# Patient Record
Sex: Male | Born: 1945 | Race: Black or African American | Hispanic: No | Marital: Single | Smoking: Current every day smoker
Health system: Southern US, Community
[De-identification: ages and names within clinical notes are randomized; demographics above are authoritative.]

## PROBLEM LIST (undated history)

## (undated) DIAGNOSIS — I10 Essential (primary) hypertension: Secondary | ICD-10-CM

## (undated) DIAGNOSIS — I639 Cerebral infarction, unspecified: Secondary | ICD-10-CM

---

## 2004-08-10 ENCOUNTER — Other Ambulatory Visit: Payer: Self-pay

## 2004-08-11 ENCOUNTER — Inpatient Hospital Stay: Payer: Self-pay | Admitting: Cardiology

## 2004-08-12 ENCOUNTER — Other Ambulatory Visit: Payer: Self-pay

## 2004-08-14 ENCOUNTER — Other Ambulatory Visit: Payer: Self-pay

## 2007-10-22 ENCOUNTER — Other Ambulatory Visit: Payer: Self-pay

## 2007-10-23 ENCOUNTER — Inpatient Hospital Stay: Payer: Self-pay | Admitting: Internal Medicine

## 2009-06-22 ENCOUNTER — Emergency Department: Payer: Self-pay | Admitting: Emergency Medicine

## 2011-11-21 ENCOUNTER — Emergency Department: Payer: Self-pay | Admitting: *Deleted

## 2011-11-21 LAB — URINALYSIS, COMPLETE
Bacteria: NONE SEEN
Glucose,UR: NEGATIVE mg/dL (ref 0–75)
Hyaline Cast: 3
Ketone: NEGATIVE
Nitrite: NEGATIVE
Protein: 30
RBC,UR: 2 /HPF (ref 0–5)
Specific Gravity: 1.018 (ref 1.003–1.030)
Squamous Epithelial: 1
Transitional Epi: <1
WBC UR: 6 /HPF (ref 0–5)

## 2011-11-21 LAB — COMPREHENSIVE METABOLIC PANEL
Albumin: 4.3 g/dL (ref 3.4–5.0)
Alkaline Phosphatase: 101 U/L (ref 50–136)
Anion Gap: 13 (ref 7–16)
BUN: 35 mg/dL — ABNORMAL HIGH (ref 7–18)
Calcium, Total: 9.1 mg/dL (ref 8.5–10.1)
Creatinine: 2.71 mg/dL — ABNORMAL HIGH (ref 0.60–1.30)
EGFR (African American): 30 — ABNORMAL LOW
EGFR (Non-African Amer.): 25 — ABNORMAL LOW
Glucose: 103 mg/dL — ABNORMAL HIGH (ref 65–99)
SGOT(AST): 32 U/L (ref 15–37)

## 2011-11-21 LAB — CBC
HCT: 42.6 % (ref 40.0–52.0)
MCHC: 32.7 g/dL (ref 32.0–36.0)
MCV: 81 fL (ref 80–100)
Platelet: 214 10*3/uL (ref 150–440)
RDW: 16 % — ABNORMAL HIGH (ref 11.5–14.5)
WBC: 7 10*3/uL (ref 3.8–10.6)

## 2012-07-17 ENCOUNTER — Emergency Department: Payer: Self-pay | Admitting: Emergency Medicine

## 2012-07-17 LAB — URINALYSIS, COMPLETE
Bilirubin,UR: NEGATIVE
Glucose,UR: NEGATIVE mg/dL (ref 0–75)
Ketone: NEGATIVE
Protein: NEGATIVE
RBC,UR: 3 /HPF (ref 0–5)
Squamous Epithelial: 1
WBC UR: 25 /HPF (ref 0–5)

## 2012-07-17 LAB — COMPREHENSIVE METABOLIC PANEL
Calcium, Total: 9 mg/dL (ref 8.5–10.1)
Chloride: 111 mmol/L — ABNORMAL HIGH (ref 98–107)
Co2: 19 mmol/L — ABNORMAL LOW (ref 21–32)
Creatinine: 2.59 mg/dL — ABNORMAL HIGH (ref 0.60–1.30)
EGFR (African American): 28 — ABNORMAL LOW
EGFR (Non-African Amer.): 24 — ABNORMAL LOW
Glucose: 93 mg/dL (ref 65–99)
Osmolality: 290 (ref 275–301)
SGOT(AST): 24 U/L (ref 15–37)
SGPT (ALT): 21 U/L (ref 12–78)

## 2012-07-17 LAB — CBC
HCT: 34.9 % — ABNORMAL LOW (ref 40.0–52.0)
MCH: 25.9 pg — ABNORMAL LOW (ref 26.0–34.0)
MCV: 78 fL — ABNORMAL LOW (ref 80–100)
Platelet: 275 10*3/uL (ref 150–440)
RDW: 16.7 % — ABNORMAL HIGH (ref 11.5–14.5)

## 2012-07-19 LAB — URINE CULTURE

## 2020-06-28 ENCOUNTER — Emergency Department
Admission: EM | Admit: 2020-06-28 | Discharge: 2020-06-28 | Disposition: A | Discharge status: Home / Self Care | Payer: Medicare Other | Attending: Student in an Organized Health Care Education/Training Program | Admitting: Student in an Organized Health Care Education/Training Program

## 2020-06-28 ENCOUNTER — Other Ambulatory Visit: Payer: Self-pay

## 2020-06-28 ENCOUNTER — Emergency Department: Payer: Medicare Other

## 2020-06-28 DIAGNOSIS — N401 Enlarged prostate with lower urinary tract symptoms: Secondary | ICD-10-CM | POA: Insufficient documentation

## 2020-06-28 DIAGNOSIS — R112 Nausea with vomiting, unspecified: Secondary | ICD-10-CM | POA: Diagnosis not present

## 2020-06-28 DIAGNOSIS — I10 Essential (primary) hypertension: Secondary | ICD-10-CM | POA: Insufficient documentation

## 2020-06-28 DIAGNOSIS — R42 Dizziness and giddiness: Secondary | ICD-10-CM

## 2020-06-28 DIAGNOSIS — R519 Headache, unspecified: Secondary | ICD-10-CM | POA: Insufficient documentation

## 2020-06-28 DIAGNOSIS — N200 Calculus of kidney: Secondary | ICD-10-CM | POA: Insufficient documentation

## 2020-06-28 DIAGNOSIS — I700 Atherosclerosis of aorta: Secondary | ICD-10-CM | POA: Diagnosis not present

## 2020-06-28 DIAGNOSIS — F17200 Nicotine dependence, unspecified, uncomplicated: Secondary | ICD-10-CM | POA: Insufficient documentation

## 2020-06-28 DIAGNOSIS — E860 Dehydration: Secondary | ICD-10-CM

## 2020-06-28 HISTORY — DX: Essential (primary) hypertension: I10

## 2020-06-28 HISTORY — DX: Cerebral infarction, unspecified: I63.9

## 2020-06-28 LAB — COMPREHENSIVE METABOLIC PANEL
ALT: 19 U/L (ref 0–44)
AST: 22 U/L (ref 15–41)
Albumin: 4.2 g/dL (ref 3.5–5.0)
Alkaline Phosphatase: 89 U/L (ref 38–126)
Anion gap: 11 (ref 5–15)
BUN: 48 mg/dL — ABNORMAL HIGH (ref 8–23)
CO2: 20 mmol/L — ABNORMAL LOW (ref 22–32)
Calcium: 9.4 mg/dL (ref 8.9–10.3)
Chloride: 108 mmol/L (ref 98–111)
Creatinine, Ser: 3.42 mg/dL — ABNORMAL HIGH (ref 0.61–1.24)
GFR, Estimated: 16 mL/min — ABNORMAL LOW (ref >60)
Glucose, Bld: 105 mg/dL — ABNORMAL HIGH (ref 70–99)
Potassium: 4.6 mmol/L (ref 3.5–5.1)
Sodium: 139 mmol/L (ref 135–145)
Total Bilirubin: 0.7 mg/dL (ref 0.3–1.2)
Total Protein: 7.5 g/dL (ref 6.5–8.1)

## 2020-06-28 LAB — CBC
HCT: 36.7 % — ABNORMAL LOW (ref 39.0–52.0)
Hemoglobin: 11.9 g/dL — ABNORMAL LOW (ref 13.0–17.0)
MCH: 26.1 pg (ref 26.0–34.0)
MCHC: 32.4 g/dL (ref 30.0–36.0)
MCV: 80.5 fL (ref 80.0–100.0)
Platelets: 205 10*3/uL (ref 150–400)
RBC: 4.56 MIL/uL (ref 4.22–5.81)
RDW: 16.7 % — ABNORMAL HIGH (ref 11.5–15.5)
WBC: 8.4 10*3/uL (ref 4.0–10.5)
nRBC: 0 % (ref 0.0–0.2)

## 2020-06-28 LAB — LIPASE, BLOOD: Lipase: 65 U/L — ABNORMAL HIGH (ref 11–51)

## 2020-06-28 LAB — URINALYSIS, COMPLETE (UACMP) WITH MICROSCOPIC
Bacteria, UA: NONE SEEN
Bilirubin Urine: NEGATIVE
Glucose, UA: NEGATIVE mg/dL
Hgb urine dipstick: NEGATIVE
Ketones, ur: NEGATIVE mg/dL
Leukocytes,Ua: NEGATIVE
Nitrite: NEGATIVE
Protein, ur: 100 mg/dL — AB
Specific Gravity, Urine: 1.013 (ref 1.005–1.030)
pH: 6 (ref 5.0–8.0)

## 2020-06-28 MED ORDER — SODIUM CHLORIDE 0.9 % IV BOLUS
1000.0000 mL | Freq: Once | INTRAVENOUS | Status: AC
  Administered 2020-06-28: 1000 mL via INTRAVENOUS

## 2020-06-28 NOTE — ED Notes (Signed)
Discharge intructions reviewed with patient and significant other. Pt AOx4, Denied pain or sob

## 2020-06-28 NOTE — Discharge Instructions (Addendum)
Please call you kidney doctor for appointment to recheck blood work and make sure you are staying hydrated.   CT abd/Pelv  1. No acute noncontrast CT findings of the abdomen or pelvis to  explain nausea or vomiting. No evidence of pancreatitis.  2. There is a multilobulated hyperdense lesion of the anterior  superior pole of the left kidney measuring approximately 2.5 x 2.4  cm, concerning for mass. A proteinaceous or hemorrhagic cyst could  have this appearance. Recommend renal ultrasound for initial further  evaluation on a nonemergent basis.  3. Nonobstructive left nephrolithiasis.  4. Prostatomegaly.  5. Aortic Atherosclerosis (ICD10-I70.0).

## 2020-06-28 NOTE — ED Triage Notes (Addendum)
Pt states he was awoken by a headache around 6AM and then vomited while drinking his coffee- pt was given 2 200mg  ibuprofen this AM which he states helped and his headache is now gone

## 2020-06-28 NOTE — ED Notes (Signed)
Patient denies pain and is resting comfortably.  

## 2020-06-28 NOTE — ED Provider Notes (Signed)
[facility] Emergency Department Provider Note    First MD Initiated Contact with Patient 06/28/20 1331     (approximate)  I have reviewed the triage vital signs and the nursing notes.   HISTORY  Chief Complaint Emesis and Headache    HPI Johnny Williamson is a 74 y.o. male history of hypertension stroke presents to the ER for evaluation of dizziness associated with nausea and headache that started this morning.  Does not typically get headaches.  States he drank some coffee as well as took some Motrin with improvement in his pain but still feeling some nausea.  Also feeling some lightheadedness particular when he gets up to walk.  Does have chronic kidney disease.  Takes a baby aspirin.  Denies any trauma.  No chest pain or shortness of breath.  Nuys any fevers.  No neck stiffness.    Past Medical History:  Diagnosis Date  . Hypertension   . Stroke [facility])    No family history on file. History reviewed. No pertinent surgical history. There are no problems to display for this patient.     Prior to Admission medications   Not on File    Allergies Patient has no known allergies.    Social History Social History   Tobacco Use  . Smoking status: Current Every Day Smoker    Packs/day: 0.25  . Smokeless tobacco: Never Used  Substance Use Topics  . Alcohol use: Not on file  . Drug use: Not on file    Review of Systems Patient denies headaches, rhinorrhea, blurry vision, numbness, shortness of breath, chest pain, edema, cough, abdominal pain, nausea, vomiting, diarrhea, dysuria, fevers, rashes or hallucinations unless otherwise stated above in HPI. ____________________________________________   PHYSICAL EXAM:  VITAL SIGNS: Vitals:   06/28/20 1153  BP: (!) 142/59  Pulse: 64  Resp: 18  Temp: 98.6 F (37 C)  SpO2: 100%    Constitutional: Alert and oriented.  Eyes: Conjunctivae are normal.  Head: Atraumatic. Nose: No  congestion/rhinnorhea. Mouth/Throat: Mucous membranes are moist.   Neck: No stridor. Painless ROM.  Cardiovascular: Normal rate, regular rhythm. Grossly normal heart sounds.  Good peripheral circulation. Respiratory: Normal respiratory effort.  No retractions. Lungs CTAB. Gastrointestinal: Soft and nontender. No distention. No abdominal bruits. No CVA tenderness. Genitourinary:  Musculoskeletal: No lower extremity tenderness nor edema.  No joint effusions. Neurologic:  Normal speech and language. No gross focal neurologic deficits are appreciated. No facial droop Skin:  Skin is warm, dry and intact. No rash noted. Psychiatric: Mood and affect are normal. Speech and behavior are normal.  ____________________________________________   LABS (all labs ordered are listed, but only abnormal results are displayed)  Results for orders placed or performed during the hospital encounter of 06/28/20 (from the past 24 hour(s))  Lipase, blood     Status: Abnormal   Collection Time: 06/28/20 12:01 PM  Result Value Ref Range   Lipase 65 (H) 11 - 51 U/L  Comprehensive metabolic panel     Status: Abnormal   Collection Time: 06/28/20 12:01 PM  Result Value Ref Range   Sodium 139 135 - 145 mmol/L   Potassium 4.6 3.5 - 5.1 mmol/L   Chloride 108 98 - 111 mmol/L   CO2 20 (L) 22 - 32 mmol/L   Glucose, Bld 105 (H) 70 - 99 mg/dL   BUN 48 (H) 8 - 23 mg/dL   Creatinine, Ser 7.35 (H) 0.61 - 1.24 mg/dL   Calcium 9.4 8.9 - 32.9 mg/dL  Total Protein 7.5 6.5 - 8.1 g/dL   Albumin 4.2 3.5 - 5.0 g/dL   AST 22 15 - 41 U/L   ALT 19 0 - 44 U/L   Alkaline Phosphatase 89 38 - 126 U/L   Total Bilirubin 0.7 0.3 - 1.2 mg/dL   GFR, Estimated 16 (L) >60 mL/min   Anion gap 11 5 - 15  CBC     Status: Abnormal   Collection Time: 06/28/20 12:01 PM  Result Value Ref Range   WBC 8.4 4.0 - 10.5 K/uL   RBC 4.56 4.22 - 5.81 MIL/uL   Hemoglobin 11.9 (L) 13.0 - 17.0 g/dL   HCT 19.1 (L) 39 - 52 %   MCV 80.5 80.0 - 100.0 fL    MCH 26.1 26.0 - 34.0 pg   MCHC 32.4 30.0 - 36.0 g/dL   RDW 47.8 (H) 29.5 - 62.1 %   Platelets 205 150 - 400 K/uL   nRBC 0.0 0.0 - 0.2 %   ____________________________________________  EKG My review and personal interpretation at Time: 13:57   Indication: nausea  Rate: 80  Rhythm: sinus Axis: normal Other: inferolateral twi, c/w previous tracing ____________________________________________  RADIOLOGY  I personally reviewed all radiographic images ordered to evaluate for the above acute complaints and reviewed radiology reports and findings.  These findings were personally discussed with the patient.  Please see medical record for radiology report.  ____________________________________________   PROCEDURES  Procedure(s) performed:  Procedures    Critical Care performed: no ____________________________________________   INITIAL IMPRESSION / ASSESSMENT AND PLAN / ED COURSE  Pertinent labs & imaging results that were available during my care of the patient were reviewed by me and considered in my medical decision making (see chart for details).   DDX: Dehydration, electrolyte abnormality, pancreatitis, enteritis, SBO, migraine, IPH  Johnny Williamson is a 74 y.o. who presents to the ED with presentation as described above. Patient nontoxic-appearing describing some symptoms of dehydration and blood work does show evidence of mild AKI but no evidence of significant electrolyte abnormality. He is not complaining of any chest pain shortness of breath. Only had one episode of vomiting. CT imaging ordered for the but differential is fortunately reassuring. Does have some chronic findings that we discussed. No sign of UTI. Will give IV fluids and assess if the patient is feeling better as I suspect this is largely secondary to dehydration.  Clinical Course as of Jun 28 1546  Sat Jun 28, 2020  1543 Patient reassessed. Feels significantly improved after IV hydration. No sign of  urinary tract obstruction by CT. Does not have any orthostasis or symptoms of lightheadedness with ambulating. He is tolerating p.o. Does appear appropriate for outpatient follow-up.   [PR]    Clinical Course User Index [PR] Willy Eddy, MD    The patient was evaluated in Emergency Department today for the symptoms described in the history of present illness. He/she was evaluated in the context of the global COVID-19 pandemic, which necessitated consideration that the patient might be at risk for infection with the SARS-CoV-2 virus that causes COVID-19. Institutional protocols and algorithms that pertain to the evaluation of patients at risk for COVID-19 are in a state of rapid change based on information released by regulatory bodies including the CDC and federal and state organizations. These policies and algorithms were followed during the patient's care in the ED.  As part of my medical decision making, I reviewed the following data within the electronic MEDICAL RECORD NUMBER Nursing notes  reviewed and incorporated, Labs reviewed, notes from prior ED visits and [place] Controlled Substance Database   ____________________________________________   FINAL CLINICAL IMPRESSION(S) / ED DIAGNOSES  Final diagnoses:  Dehydration  Lightheadedness      NEW MEDICATIONS STARTED DURING THIS VISIT:  New Prescriptions   No medications on file     Note:  This document was prepared using Dragon voice recognition software and may include unintentional dictation errors.    Willy Eddy, MD 06/28/20 [phone]

## 2021-06-01 IMAGING — CT CT HEAD W/O CM
3 series · 16 of 47 positions shown, 19 images · non-contrast
Comparison: 11/21/2011

CLINICAL DATA: Headache, dizziness

EXAM:
CT HEAD WITHOUT CONTRAST
TECHNIQUE: Contiguous axial images were obtained from the base of the skull
through the vertex without intravenous contrast.

[Series 2: head wo · axial · 0.41mm/px · z∈[-132,-2]mm · 10 of 32 slices shown, 13 images]
[im 3/32  brain]
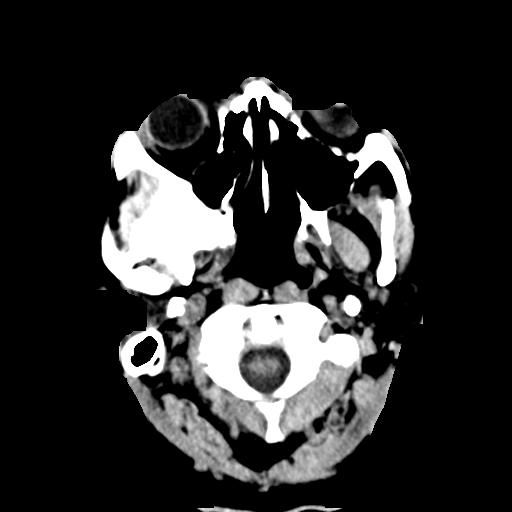
[im 3/32  bone]
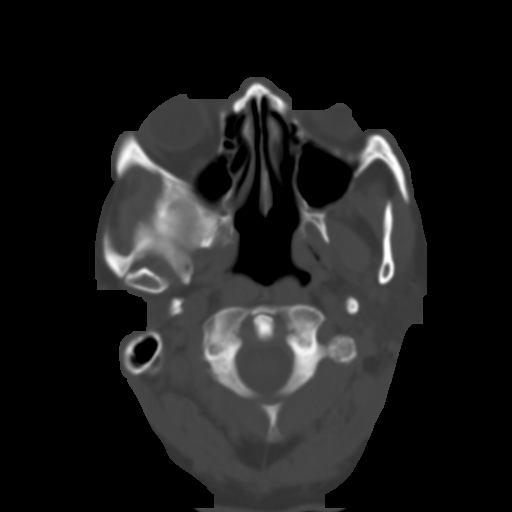
[im 6/32  brain]
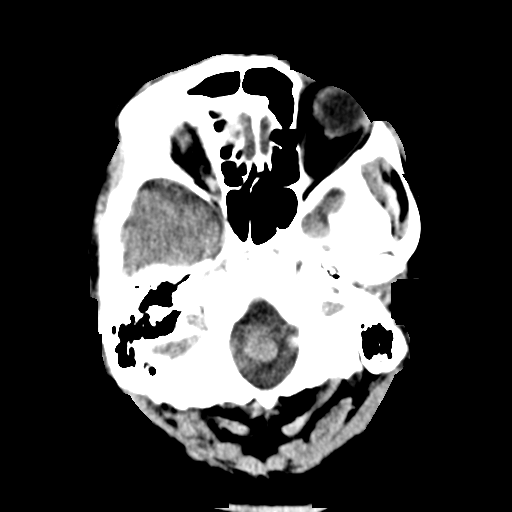
[im 9/32  brain]
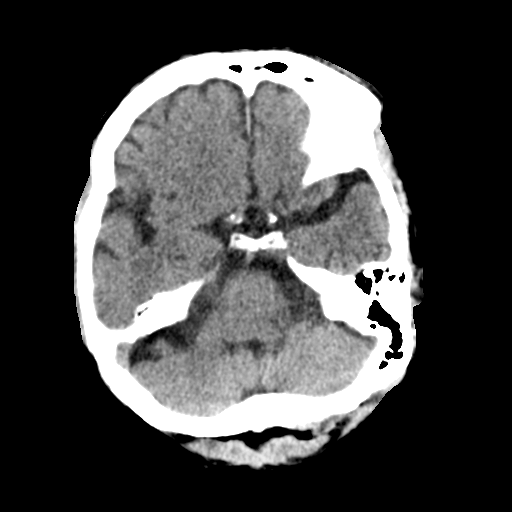
[im 11/32  brain]
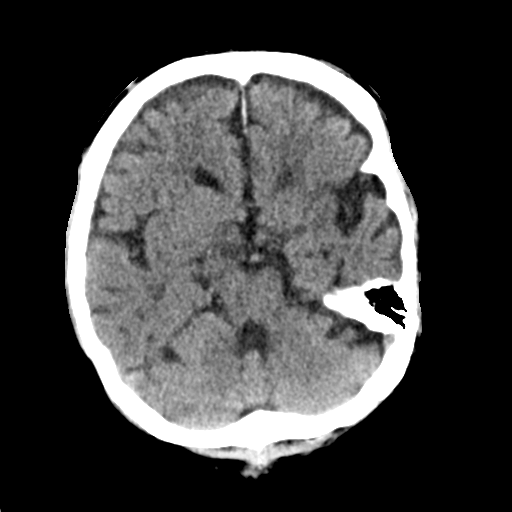
[im 14/32  brain]
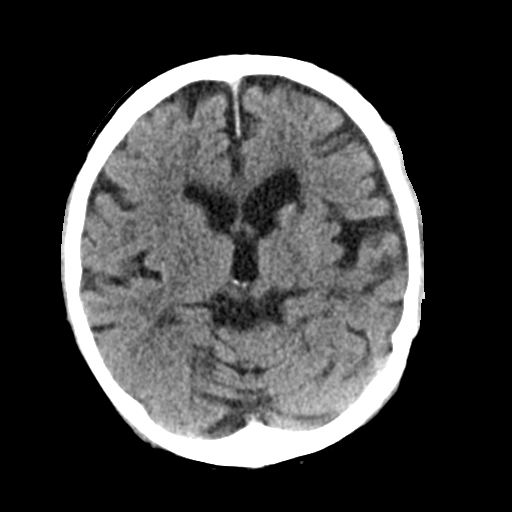
[im 14/32  bone]
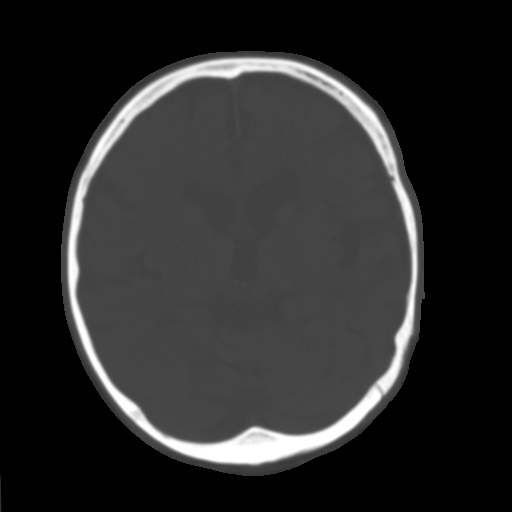
[im 18/32  brain]
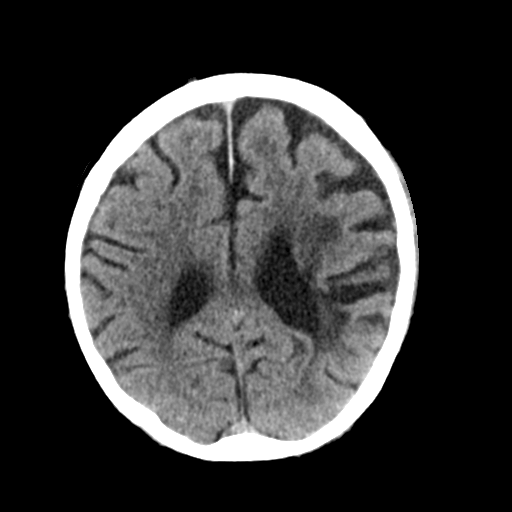
[im 21/32  brain]
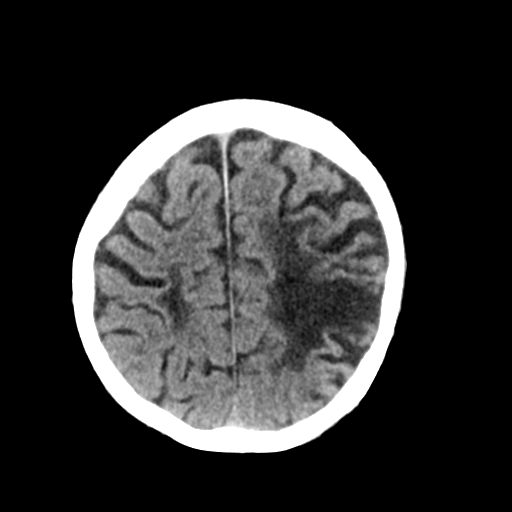
[im 24/32  brain]
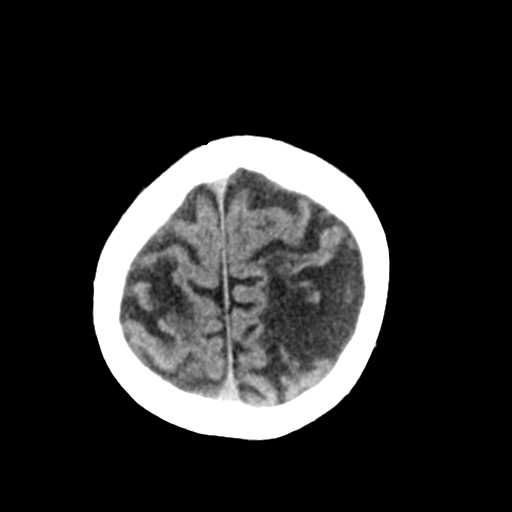
[im 26/32  brain]
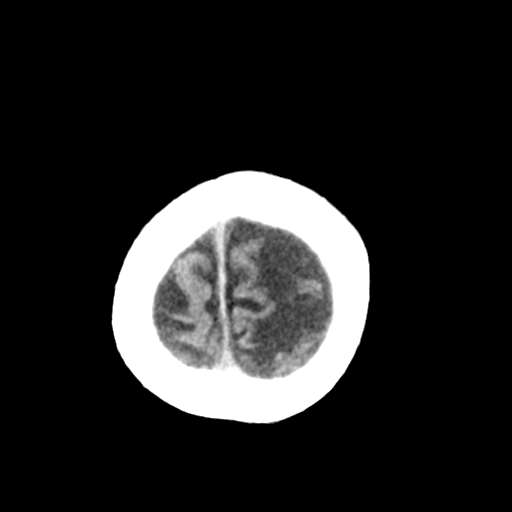
[im 26/32  bone]
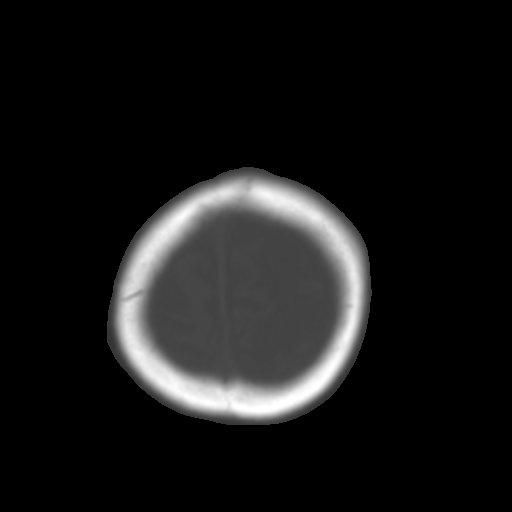
[im 29/32  brain]
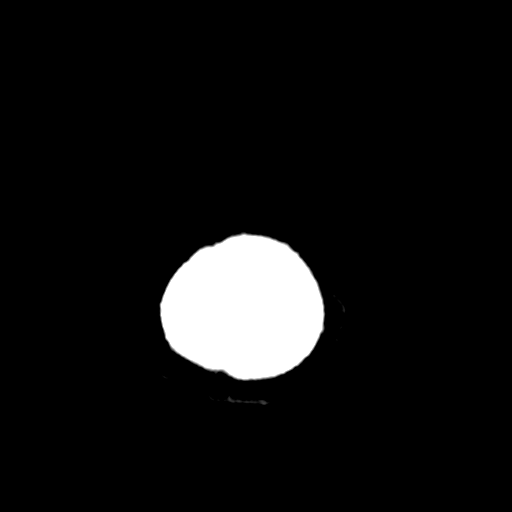

[Series 4: coronal soft tissue · coronal · 0.33mm/px · 3 of 68 slices shown]
[im 23/68  brain]
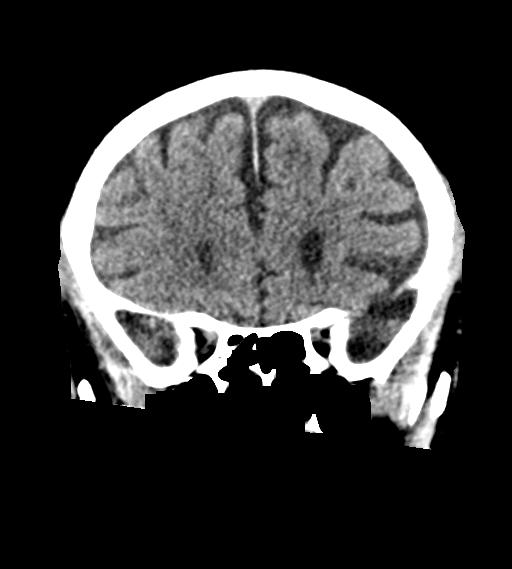
[im 30/68  brain]
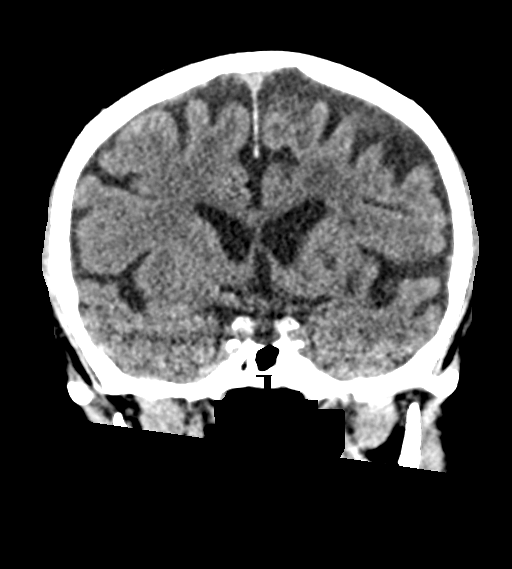
[im 38/68  brain]
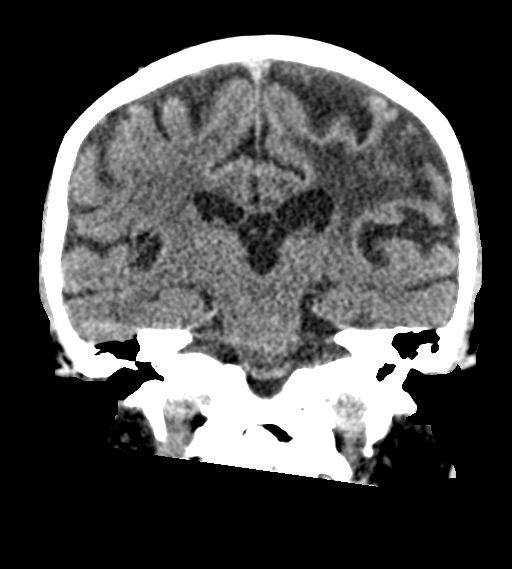

[Series 5: sagittal soft tissue · sagittal · 0.38mm/px · 3 of 58 slices shown]
[im 21/58  brain]
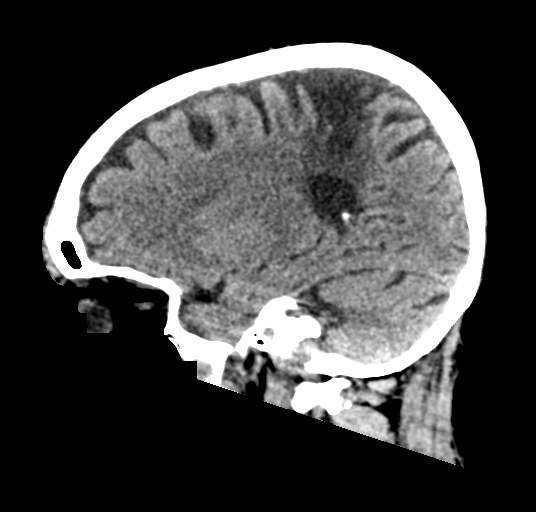
[im 29/58  brain]
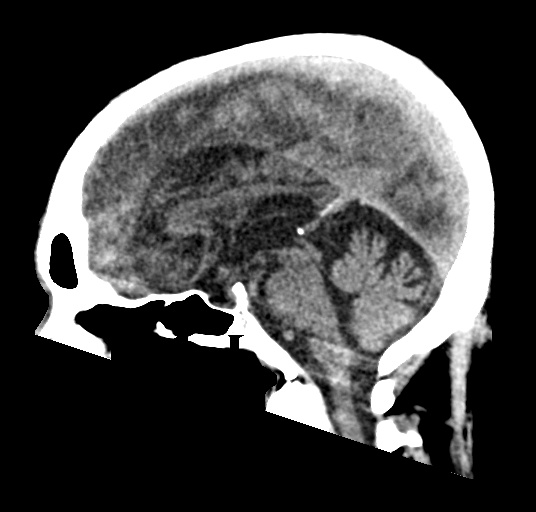
[im 37/58  brain]
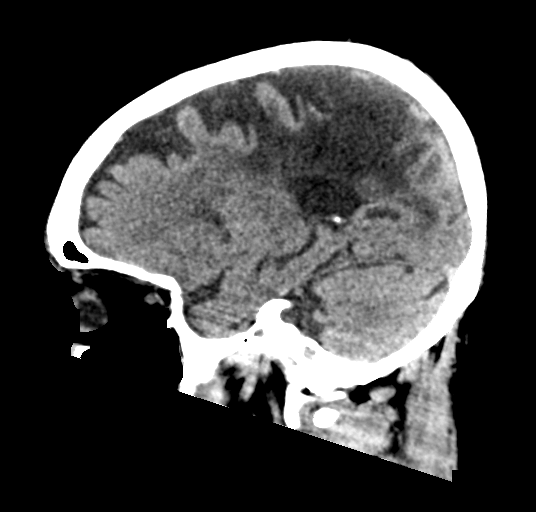

[16 of 47 positions shown; findings below may reference images not displayed]

FINDINGS: Brain: No evidence of acute infarction, hemorrhage, hydrocephalus,
extra-axial collection or mass lesion/mass effect. Redemonstrated
left frontoparietal encephalomalacia as well as a smaller focus of
right posterior frontal encephalomalacia, findings in keeping with
prior MCA territory infarctions. Periventricular and deep white
matter hypodensity.

Vascular: No hyperdense vessel or unexpected calcification.

Skull: Normal. Negative for fracture or focal lesion.

Sinuses/Orbits: No acute finding.

Other: None.
IMPRESSION: 1. No acute intracranial pathology.
2. Redemonstrated left frontoparietal encephalomalacia as well as a
smaller focus of right posterior frontal encephalomalacia, findings
in keeping with prior MCA territory infarctions.
3. Small-vessel white matter disease.
